# Patient Record
Sex: Female | Born: 1955 | Race: Black or African American | Hispanic: No | Marital: Married | Smoking: Current every day smoker
Health system: Southern US, Community
[De-identification: ages and names within clinical notes are randomized; demographics above are authoritative.]

## PROBLEM LIST (undated history)

## (undated) HISTORY — PX: HIP SURGERY: SHX245

---

## 2001-02-02 ENCOUNTER — Inpatient Hospital Stay (HOSPITAL_COMMUNITY): Admission: EM | Admit: 2001-02-02 | Discharge: 2001-02-03 | Payer: Self-pay | Admitting: Emergency Medicine

## 2001-02-02 ENCOUNTER — Encounter: Payer: Self-pay | Admitting: Emergency Medicine

## 2001-03-02 ENCOUNTER — Encounter: Admission: RE | Admit: 2001-03-02 | Discharge: 2001-05-31 | Payer: Self-pay | Admitting: Orthopedic Surgery

## 2001-04-15 ENCOUNTER — Emergency Department (HOSPITAL_COMMUNITY): Admission: EM | Admit: 2001-04-15 | Discharge: 2001-04-15 | Payer: Self-pay | Admitting: Emergency Medicine

## 2001-09-18 ENCOUNTER — Emergency Department (HOSPITAL_COMMUNITY): Admission: EM | Admit: 2001-09-18 | Discharge: 2001-09-19 | Payer: Self-pay | Admitting: Emergency Medicine

## 2001-09-18 ENCOUNTER — Encounter: Payer: Self-pay | Admitting: Emergency Medicine

## 2003-01-18 ENCOUNTER — Encounter: Payer: Self-pay | Admitting: Emergency Medicine

## 2003-01-19 ENCOUNTER — Encounter: Payer: Self-pay | Admitting: Emergency Medicine

## 2003-01-19 ENCOUNTER — Encounter: Payer: Self-pay | Admitting: Surgery

## 2003-01-19 ENCOUNTER — Inpatient Hospital Stay (HOSPITAL_COMMUNITY): Admission: EM | Admit: 2003-01-19 | Discharge: 2003-01-19 | Payer: Self-pay | Admitting: Emergency Medicine

## 2005-05-12 ENCOUNTER — Inpatient Hospital Stay (HOSPITAL_COMMUNITY): Admission: EM | Admit: 2005-05-12 | Discharge: 2005-05-16 | Payer: Self-pay | Admitting: Emergency Medicine

## 2005-05-17 ENCOUNTER — Ambulatory Visit: Payer: Self-pay | Admitting: Internal Medicine

## 2009-01-26 ENCOUNTER — Emergency Department (HOSPITAL_COMMUNITY): Admission: EM | Admit: 2009-01-26 | Discharge: 2009-01-27 | Payer: Self-pay | Admitting: Emergency Medicine
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
## 2018-02-18 ENCOUNTER — Encounter (HOSPITAL_COMMUNITY): Payer: Self-pay | Admitting: Emergency Medicine

## 2018-02-18 ENCOUNTER — Ambulatory Visit (HOSPITAL_COMMUNITY)
Admission: EM | Admit: 2018-02-18 | Discharge: 2018-02-18 | Disposition: A | Discharge status: Home / Self Care | Payer: Medicaid Other | Attending: Family Medicine | Admitting: Family Medicine

## 2018-02-18 ENCOUNTER — Ambulatory Visit (INDEPENDENT_AMBULATORY_CARE_PROVIDER_SITE_OTHER): Payer: Medicaid Other

## 2018-02-18 DIAGNOSIS — S52692A Other fracture of lower end of left ulna, initial encounter for closed fracture: Secondary | ICD-10-CM | POA: Diagnosis not present

## 2018-02-18 MED ORDER — HYDROCODONE-ACETAMINOPHEN 5-325 MG PO TABS: 1.0000 | ORAL_TABLET | Freq: Four times a day (QID) | ORAL | 0 refills | Status: AC | PRN

## 2018-02-18 NOTE — Progress Notes (Signed)
Orthopedic Tech Progress Note Patient Details:  Jacqueline Ballard 03-Aug-1955 [id number]  Ortho Devices Type of Ortho Device: Ace wrap, Ulna gutter splint, Arm sling Ortho Device/Splint Location: lue Ortho Device/Splint Interventions: Application   Post Interventions Patient Tolerated: Well Instructions Provided: Care of device   Nikki Dom 02/18/2018, 7:28 PM

## 2018-02-18 NOTE — Discharge Instructions (Addendum)
Be aware, pain medications may cause drowsiness. Please do not drive, operate heavy machinery or make important decisions while on this medication, it can cloud your judgement. ° °Please rest, ice and elevate the affected extremity. If not allergic, you may take Motrin 600mg every 8 hours or Tylenol 1000mg every 6 hours or as needed for discomfort. Follow up with orthopaedic surgery within one week for further evaluation. Please call for any appointment. Do not remove your splint. You may use a garbage bag while showering to keep your splint dry. Please return here if you are experiencing increased pain, tingling/numbness, swelling, redness, or fever. ° °
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
## 2018-02-21 ENCOUNTER — Ambulatory Visit (HOSPITAL_COMMUNITY)
Admission: EM | Admit: 2018-02-21 | Discharge: 2018-02-21 | Disposition: A | Discharge status: Home / Self Care | Payer: Medicaid Other | Attending: Family Medicine | Admitting: Family Medicine

## 2018-02-21 ENCOUNTER — Emergency Department (HOSPITAL_COMMUNITY)
Admission: EM | Admit: 2018-02-21 | Discharge: 2018-02-21 | Disposition: A | Discharge status: Home / Self Care | Payer: Medicaid Other | Attending: Emergency Medicine | Admitting: Emergency Medicine

## 2018-02-21 ENCOUNTER — Other Ambulatory Visit: Payer: Self-pay

## 2018-02-21 DIAGNOSIS — Y939 Activity, unspecified: Secondary | ICD-10-CM | POA: Diagnosis not present

## 2018-02-21 DIAGNOSIS — Y929 Unspecified place or not applicable: Secondary | ICD-10-CM | POA: Diagnosis not present

## 2018-02-21 DIAGNOSIS — Y999 Unspecified external cause status: Secondary | ICD-10-CM | POA: Diagnosis not present

## 2018-02-21 DIAGNOSIS — Z4789 Encounter for other orthopedic aftercare: Secondary | ICD-10-CM | POA: Insufficient documentation

## 2018-02-21 DIAGNOSIS — Z7982 Long term (current) use of aspirin: Secondary | ICD-10-CM | POA: Diagnosis not present

## 2018-02-21 DIAGNOSIS — S52602A Unspecified fracture of lower end of left ulna, initial encounter for closed fracture: Secondary | ICD-10-CM

## 2018-02-21 DIAGNOSIS — X58XXXD Exposure to other specified factors, subsequent encounter: Secondary | ICD-10-CM | POA: Insufficient documentation

## 2018-02-21 DIAGNOSIS — S52602D Unspecified fracture of lower end of left ulna, subsequent encounter for closed fracture with routine healing: Secondary | ICD-10-CM | POA: Diagnosis not present

## 2018-02-21 DIAGNOSIS — F17200 Nicotine dependence, unspecified, uncomplicated: Secondary | ICD-10-CM | POA: Insufficient documentation

## 2018-02-21 NOTE — ED Provider Notes (Signed)
[facility] EMERGENCY DEPARTMENT Provider Note  CSN: [id number] Arrival date & time: 02/21/18  0940    History   Chief Complaint Chief Complaint  Patient presents with  . Arm Injury   HPI Jacqueline Ballard is a 62 y.o. female with no significant medical history who presented to the ED for arm fracture follow-up. Patient was seen in the ED on 02/18/18 following an injury which resulted in a fractured ulna. She was instructed to follow-up with ortho, but decided instead to follow-up in the ED to get a cast. Denies any change in pain, color or temperature changes in the fingers or arm or paresthesias.  No past medical history on file.  There are no active problems to display for this patient.   Past Surgical History:  Procedure Laterality Date  . HIP SURGERY       OB History   None      Home Medications    Prior to Admission medications   Medication Sig Start Date End Date Taking? Authorizing Provider  ASPIRIN 81 PO Take by mouth.    [provider]  HYDROcodone-acetaminophen (NORCO/VICODIN) 5-325 MG tablet Take 1 tablet by mouth every 6 (six) hours as needed for moderate pain or severe pain. 02/18/18   Mardella Layman, MD    Family History Family History  Problem Relation Age of Onset  . Healthy Other     Social History Social History   Tobacco Use  . Smoking status: Current Every Day Smoker  Substance Use Topics  . Alcohol use: Yes  . Drug use: Not on file     Allergies   Patient has no known allergies.   Review of Systems Review of Systems  Constitutional: Negative.   Musculoskeletal:       Left ulna fracture  Skin: Negative for color change and pallor.  Neurological: Negative for weakness and numbness.  Hematological: Does not bruise/bleed easily.   Physical Exam Updated Vital Signs BP 119/87 (BP Location: Right Arm)   Pulse 77   Temp 98 F (36.7 C) (Oral)   Resp 16   Ht 5\' 6"  (1.676 m)   Wt 64.4 kg   SpO2 100%    BMI 22.92 kg/m   Physical Exam  Constitutional: She appears well-developed and well-nourished.  Musculoskeletal:  Ulnar gutter splint applied to the left arm. Patient able to move available fingers freely and without issue. Soft forearm compartments.  Neurological: She displays no atrophy. No sensory deficit. She exhibits normal muscle tone.  Reflex Scores:      Brachioradialis reflexes are 2+ on the right side and 2+ on the left side. Skin: Skin is warm and intact. Capillary refill takes less than 2 seconds. No erythema. No pallor.  Psychiatric: She has a normal mood and affect. Her speech is normal and behavior is normal.  Nursing note and vitals reviewed.  ED Treatments / Results  Labs (all labs ordered are listed, but only abnormal results are displayed) Labs Reviewed - No data to display  EKG None  Radiology No results found.  Procedures Procedures (including critical care time)  Medications Ordered in ED Medications - No data to display   Initial Impression / Assessment and Plan / ED Course  Triage vital signs and the nursing notes have been reviewed.  Pertinent labs & imaging results that were available during care of the patient were reviewed and considered in medical decision making (see chart for details).   Patient presents to the ED to  get a cast applied to her fractured arm. Initial splint was applied on 02/18/18 and patient received instruction to follow-up with the ortho provider, Dr. Susa Simmonds. However, patient mistakenly came to the ED because she believed that she could receive a hard cast here. On exam, patient has good capillary refill, soft forearm compartments and able to move available digits freely without issue or pain which is reassuring that patient has not developed compartment syndrome. Education provided on appropriate ortho follow-up and given contact info for Dr. Donnie Mesa office.  Final Clinical Impressions(s) / ED Diagnoses   Dispo: Home. After  thorough clinical evaluation, this patient is determined to be medically stable and can be safely discharged with the previously mentioned treatment and/or outpatient follow-up/referral(s). At this time, there are no other apparent medical conditions that require further screening, evaluation or treatment.  Final diagnoses:  Closed fracture of distal end of left ulna, unspecified fracture morphology, initial encounter    ED Discharge Orders    None        Reva Bores 02/21/18 1125    Cathren Laine, MD 02/21/18 1314

## 2018-02-21 NOTE — Discharge Instructions (Signed)
Please call Dr. Donnie Mesa office to schedule a follow-up. We do not apply the hard cast in the emergency room. You need to follow-up with the orthopedic doctor, Dr. Susa Simmonds.  I wish you a speedy recovery and healing.

## 2018-02-21 NOTE — ED Notes (Signed)
Pt here for refill of her pain medication that was given to her on 02/18/18.  Pt should still have 5 pills left and she ran out yesterday.  She was seen in ED for same thing and they were told they could not get a refill.  Dr. Delton See spoke with the provider in the ED and she has stated that we cannot refill her pain medication here as well.  Pt was in NAD.  They stated that they are waiting on the Orthopedist to return their call today to make an appointment with them to be seen.  I informed pt that he should call them back and let them know that she is in pain and needs to be seen sooner than later.

## 2018-02-21 NOTE — ED Notes (Signed)
ED Provider at bedside. 
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
## 2019-06-01 IMAGING — DX DG FOREARM 2V*L*
2 series · 2 of 2 positions shown · non-contrast
Comparison: None.

CLINICAL DATA: Injury to the left forearm

EXAM:
LEFT FOREARM - 2 VIEW

[forearm ap]
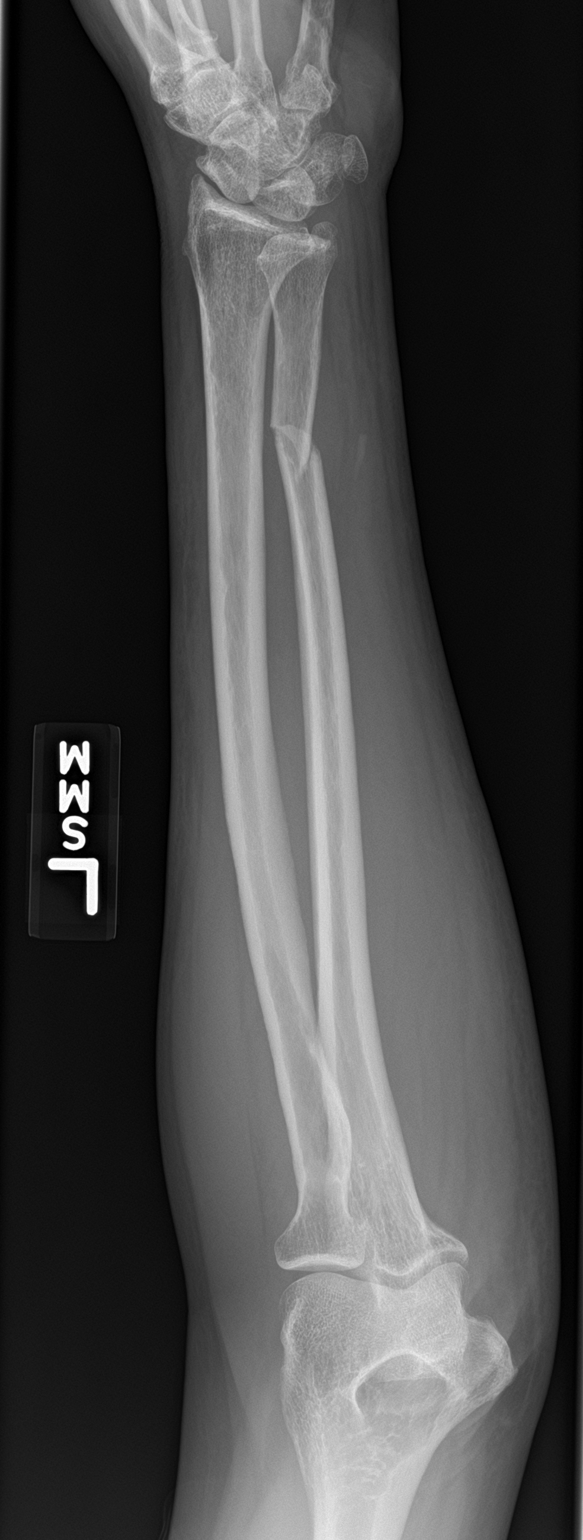

[forearm lat]
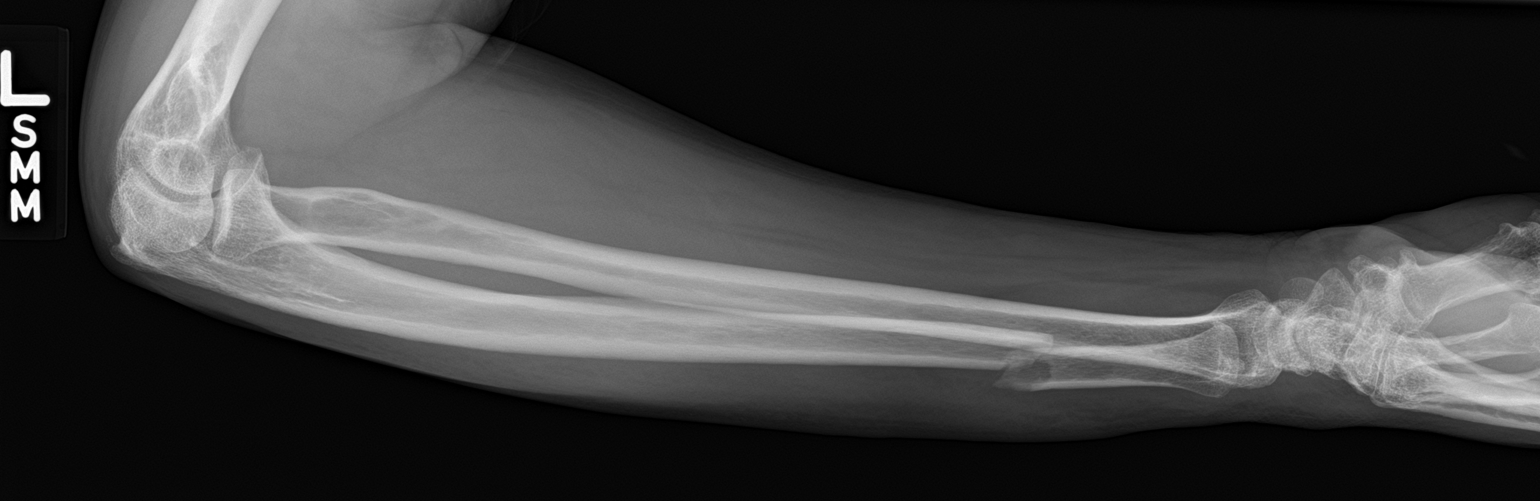

[2 of 2 positions shown; findings below may reference images not displayed]

FINDINGS: Oblique non articular distal shaft left ulna fracture with 5 mm
dorsal displacement of the distal fracture fragment, with
surrounding soft tissue swelling. No additional fracture. No
suspicious focal osseous lesion. No radiopaque foreign body.
IMPRESSION: Distal shaft left ulna fracture as detailed.

## 2019-08-25 ENCOUNTER — Ambulatory Visit: Payer: Medicaid Other | Attending: Internal Medicine

## 2019-08-25 DIAGNOSIS — Z23 Encounter for immunization: Secondary | ICD-10-CM

## 2019-08-25 NOTE — Progress Notes (Signed)
   Covid-19 Vaccination Clinic  Name:  Jacqueline Ballard    MRN: [id number] DOB: 1956/03/26  08/25/2019  Ms. Jacqueline Ballard was observed post Covid-19 immunization for 15 minutes without incident. She was provided with Vaccine Information Sheet and instruction to access the V-Safe system.   Ms. Jacqueline Ballard was instructed to call 911 with any severe reactions post vaccine: [name] Kitchen Difficulty breathing  . Swelling of face and throat  . A fast heartbeat  . A bad rash all over body  . Dizziness and weakness   Immunizations Administered    Name Date Dose VIS Date Route   Pfizer COVID-19 Vaccine 08/25/2019 10:48 AM 0.3 mL 04/20/2019 Intramuscular   Manufacturer: [employer], [employer]   Lot: [id number]   NDC: [address]-0

## 2019-09-18 ENCOUNTER — Ambulatory Visit: Payer: Medicaid Other | Attending: Internal Medicine

## 2019-09-18 DIAGNOSIS — Z23 Encounter for immunization: Secondary | ICD-10-CM

## 2019-09-18 NOTE — Progress Notes (Signed)
   Covid-19 Vaccination Clinic  Name:  Jacqueline Ballard    MRN: [id number] DOB: Dec 30, 1955  09/18/2019  [name] was observed post Covid-19 immunization for 15 minutes without incident. She was provided with Vaccine Information Sheet and instruction to access the V-Safe system.   [name] was instructed to call 911 with any severe reactions post vaccine: [name] Kitchen Difficulty breathing  . Swelling of face and throat  . A fast heartbeat  . A bad rash all over body  . Dizziness and weakness   Immunizations Administered    Name Date Dose VIS Date Route   Pfizer COVID-19 Vaccine 09/18/2019 10:02 AM 0.3 mL 07/04/2018 Intramuscular   Manufacturer: [employer], [employer]   Lot: [id number]   NDC: [address]-0

## 2020-08-28 DIAGNOSIS — Z720 Tobacco use: Secondary | ICD-10-CM | POA: Insufficient documentation

## 2020-09-01 ENCOUNTER — Other Ambulatory Visit: Payer: Self-pay | Admitting: Family Medicine

## 2020-09-01 DIAGNOSIS — Z1231 Encounter for screening mammogram for malignant neoplasm of breast: Secondary | ICD-10-CM

## 2020-09-15 DIAGNOSIS — E785 Hyperlipidemia, unspecified: Secondary | ICD-10-CM | POA: Insufficient documentation

## 2020-10-28 ENCOUNTER — Ambulatory Visit: Payer: Medicaid Other

## 2021-06-01 DIAGNOSIS — I10 Essential (primary) hypertension: Secondary | ICD-10-CM | POA: Insufficient documentation

## 2021-08-26 ENCOUNTER — Other Ambulatory Visit: Payer: Self-pay | Admitting: Nurse Practitioner

## 2021-08-26 DIAGNOSIS — Z720 Tobacco use: Secondary | ICD-10-CM

## 2021-08-26 DIAGNOSIS — Z122 Encounter for screening for malignant neoplasm of respiratory organs: Secondary | ICD-10-CM

## 2021-08-27 ENCOUNTER — Other Ambulatory Visit: Payer: Self-pay | Admitting: Nurse Practitioner

## 2021-08-27 DIAGNOSIS — Z1231 Encounter for screening mammogram for malignant neoplasm of breast: Secondary | ICD-10-CM

## 2021-09-22 ENCOUNTER — Other Ambulatory Visit: Payer: Medicaid Other

## 2021-09-22 ENCOUNTER — Ambulatory Visit: Payer: Medicaid Other

## 2021-10-15 ENCOUNTER — Ambulatory Visit: Payer: Medicaid Other

## 2024-06-13 ENCOUNTER — Inpatient Hospital Stay (HOSPITAL_COMMUNITY)
Admission: EM | Admit: 2024-06-13 | Source: Home / Self Care | Attending: Internal Medicine | Admitting: Internal Medicine

## 2024-06-13 ENCOUNTER — Emergency Department (HOSPITAL_COMMUNITY)

## 2024-06-13 DIAGNOSIS — D489 Neoplasm of uncertain behavior, unspecified: Secondary | ICD-10-CM

## 2024-06-13 DIAGNOSIS — E871 Hypo-osmolality and hyponatremia: Secondary | ICD-10-CM | POA: Diagnosis not present

## 2024-06-13 DIAGNOSIS — J09X1 Influenza due to identified novel influenza A virus with pneumonia: Secondary | ICD-10-CM | POA: Diagnosis present

## 2024-06-13 DIAGNOSIS — R112 Nausea with vomiting, unspecified: Secondary | ICD-10-CM

## 2024-06-13 DIAGNOSIS — J189 Pneumonia, unspecified organism: Secondary | ICD-10-CM

## 2024-06-13 DIAGNOSIS — E872 Acidosis, unspecified: Secondary | ICD-10-CM

## 2024-06-13 DIAGNOSIS — K567 Ileus, unspecified: Secondary | ICD-10-CM | POA: Diagnosis not present

## 2024-06-13 DIAGNOSIS — N179 Acute kidney failure, unspecified: Principal | ICD-10-CM | POA: Diagnosis present

## 2024-06-13 DIAGNOSIS — J101 Influenza due to other identified influenza virus with other respiratory manifestations: Secondary | ICD-10-CM | POA: Diagnosis present

## 2024-06-13 LAB — RESP PANEL BY RT-PCR (RSV, FLU A&B, COVID)  RVPGX2
Influenza A by PCR: POSITIVE — AB
Influenza B by PCR: NEGATIVE
Resp Syncytial Virus by PCR: NEGATIVE
SARS Coronavirus 2 by RT PCR: NEGATIVE

## 2024-06-13 LAB — CBC WITH DIFFERENTIAL/PLATELET
Abs Immature Granulocytes: 0.04 10*3/uL (ref 0.00–0.07)
Basophils Absolute: 0 10*3/uL (ref 0.0–0.1)
Basophils Relative: 0 %
Eosinophils Absolute: 0 10*3/uL (ref 0.0–0.5)
Eosinophils Relative: 0 %
HCT: 45.8 % (ref 36.0–46.0)
Hemoglobin: 14.7 g/dL (ref 12.0–15.0)
Immature Granulocytes: 1 %
Lymphocytes Relative: 16 %
Lymphs Abs: 1.4 10*3/uL (ref 0.7–4.0)
MCH: 23.3 pg — ABNORMAL LOW (ref 26.0–34.0)
MCHC: 32.1 g/dL (ref 30.0–36.0)
MCV: 72.6 fL — ABNORMAL LOW (ref 80.0–100.0)
Monocytes Absolute: 1 10*3/uL (ref 0.1–1.0)
Monocytes Relative: 12 %
Neutro Abs: 6.1 10*3/uL (ref 1.7–7.7)
Neutrophils Relative %: 71 %
Platelets: 157 10*3/uL (ref 150–400)
RBC: 6.31 MIL/uL — ABNORMAL HIGH (ref 3.87–5.11)
RDW: 13.1 % (ref 11.5–15.5)
WBC: 8.5 10*3/uL (ref 4.0–10.5)
nRBC: 0 % (ref 0.0–0.2)

## 2024-06-13 LAB — URINALYSIS, ROUTINE W REFLEX MICROSCOPIC
Bilirubin Urine: NEGATIVE
Glucose, UA: NEGATIVE mg/dL
Ketones, ur: NEGATIVE mg/dL
Nitrite: NEGATIVE
Protein, ur: 30 mg/dL — AB
Specific Gravity, Urine: 1.02 (ref 1.005–1.030)
pH: 6 (ref 5.0–8.0)

## 2024-06-13 LAB — COMPREHENSIVE METABOLIC PANEL WITH GFR
ALT: 52 U/L — ABNORMAL HIGH (ref 0–44)
AST: 64 U/L — ABNORMAL HIGH (ref 15–41)
Albumin: 4.2 g/dL (ref 3.5–5.0)
Alkaline Phosphatase: 63 U/L (ref 38–126)
Anion gap: 16 — ABNORMAL HIGH (ref 5–15)
BUN: 86 mg/dL — ABNORMAL HIGH (ref 8–23)
CO2: 24 mmol/L (ref 22–32)
Calcium: 9.4 mg/dL (ref 8.9–10.3)
Chloride: 88 mmol/L — ABNORMAL LOW (ref 98–111)
Creatinine, Ser: 1.99 mg/dL — ABNORMAL HIGH (ref 0.44–1.00)
GFR, Estimated: 27 mL/min — ABNORMAL LOW
Glucose, Bld: 147 mg/dL — ABNORMAL HIGH (ref 70–99)
Potassium: 3.7 mmol/L (ref 3.5–5.1)
Sodium: 128 mmol/L — ABNORMAL LOW (ref 135–145)
Total Bilirubin: 1 mg/dL (ref 0.0–1.2)
Total Protein: 8.5 g/dL — ABNORMAL HIGH (ref 6.5–8.1)

## 2024-06-13 LAB — BASIC METABOLIC PANEL WITH GFR
Anion gap: 13 (ref 5–15)
BUN: 71 mg/dL — ABNORMAL HIGH (ref 8–23)
CO2: 23 mmol/L (ref 22–32)
Calcium: 8.7 mg/dL — ABNORMAL LOW (ref 8.9–10.3)
Chloride: 93 mmol/L — ABNORMAL LOW (ref 98–111)
Creatinine, Ser: 1.51 mg/dL — ABNORMAL HIGH (ref 0.44–1.00)
GFR, Estimated: 38 mL/min — ABNORMAL LOW
Glucose, Bld: 109 mg/dL — ABNORMAL HIGH (ref 70–99)
Potassium: 3.3 mmol/L — ABNORMAL LOW (ref 3.5–5.1)
Sodium: 129 mmol/L — ABNORMAL LOW (ref 135–145)

## 2024-06-13 MED ORDER — ACETAMINOPHEN 325 MG PO TABS: 650.0000 mg | ORAL_TABLET | Freq: Four times a day (QID) | ORAL | Status: AC | PRN

## 2024-06-13 MED ORDER — LACTATED RINGERS IV BOLUS
500.0000 mL | Freq: Once | INTRAVENOUS | Status: AC
  Administered 2024-06-13: 500 mL via INTRAVENOUS

## 2024-06-13 MED ORDER — OSELTAMIVIR PHOSPHATE 30 MG PO CAPS
30.0000 mg | ORAL_CAPSULE | Freq: Every day | ORAL | Status: AC
  Administered 2024-06-14 – 2024-06-15 (×2): 30 mg via ORAL
  Filled 2024-06-13 (×2): qty 1

## 2024-06-13 MED ORDER — OSELTAMIVIR PHOSPHATE 30 MG PO CAPS
30.0000 mg | ORAL_CAPSULE | Freq: Once | ORAL | Status: AC
  Administered 2024-06-13: 30 mg via ORAL
  Filled 2024-06-13: qty 1

## 2024-06-13 MED ORDER — ONDANSETRON HCL 4 MG/2ML IJ SOLN
4.0000 mg | Freq: Once | INTRAMUSCULAR | Status: AC
  Administered 2024-06-13: 4 mg via INTRAVENOUS
  Filled 2024-06-13: qty 2

## 2024-06-13 MED ORDER — SODIUM CHLORIDE 0.9% FLUSH
3.0000 mL | Freq: Two times a day (BID) | INTRAVENOUS | Status: AC
  Administered 2024-06-13 – 2024-06-15 (×5): 3 mL via INTRAVENOUS

## 2024-06-13 MED ORDER — SODIUM CHLORIDE 0.9 % IV BOLUS (SEPSIS)
500.0000 mL | Freq: Once | INTRAVENOUS | Status: AC
  Administered 2024-06-13: 500 mL via INTRAVENOUS

## 2024-06-13 MED ORDER — ONDANSETRON HCL 4 MG PO TABS: 4.0000 mg | ORAL_TABLET | Freq: Four times a day (QID) | ORAL | Status: AC | PRN

## 2024-06-13 MED ORDER — BISACODYL 5 MG PO TBEC: 5.0000 mg | DELAYED_RELEASE_TABLET | Freq: Every day | ORAL | Status: AC | PRN

## 2024-06-13 MED ORDER — ONDANSETRON HCL 4 MG/2ML IJ SOLN: 4.0000 mg | Freq: Four times a day (QID) | INTRAMUSCULAR | Status: AC | PRN

## 2024-06-13 MED ORDER — LACTATED RINGERS IV SOLN: INTRAVENOUS | Status: AC

## 2024-06-13 MED ORDER — ACETAMINOPHEN 650 MG RE SUPP: 650.0000 mg | Freq: Four times a day (QID) | RECTAL | Status: AC | PRN

## 2024-06-13 MED ORDER — ASPIRIN 81 MG PO TBEC
81.0000 mg | DELAYED_RELEASE_TABLET | Freq: Every day | ORAL | Status: AC
  Administered 2024-06-14 – 2024-06-15 (×2): 81 mg via ORAL
  Filled 2024-06-13 (×2): qty 1

## 2024-06-13 MED ORDER — AZITHROMYCIN 250 MG PO TABS
500.0000 mg | ORAL_TABLET | Freq: Once | ORAL | Status: AC
  Administered 2024-06-13: 500 mg via ORAL
  Filled 2024-06-13: qty 2

## 2024-06-13 MED ORDER — ENOXAPARIN SODIUM 30 MG/0.3ML IJ SOSY
30.0000 mg | PREFILLED_SYRINGE | INTRAMUSCULAR | Status: AC
  Administered 2024-06-13 – 2024-06-15 (×3): 30 mg via SUBCUTANEOUS
  Filled 2024-06-13 (×3): qty 0.3

## 2024-06-13 MED ORDER — BISMUTH SUBSALICYLATE 262 MG/15ML PO SUSP: 30.0000 mL | ORAL | Status: AC | PRN

## 2024-06-13 MED ORDER — SODIUM CHLORIDE 0.9 % IV SOLN
1.0000 g | Freq: Once | INTRAVENOUS | Status: AC
  Administered 2024-06-13: 1 g via INTRAVENOUS
  Filled 2024-06-13: qty 10

## 2024-06-13 NOTE — ED Notes (Signed)
 Pt to CT

## 2024-06-13 NOTE — Progress Notes (Signed)
 PHARMACY NOTE:  ANTIMICROBIAL RENAL DOSAGE ADJUSTMENT  Current antimicrobial regimen includes a mismatch between antimicrobial dosage and estimated renal function.  As per policy approved by the Pharmacy & Therapeutics and Medical Executive Committees, the antimicrobial dosage will be adjusted accordingly.  Current antimicrobial dosage:  oseltamivir  75 mg BID  Indication: influenza treatment  Renal Function:  CrCl cannot be calculated (Unknown ideal weight.). Last weight ~ 52 kg CrCl < 30 ml/min    Antimicrobial dosage has been changed to:  oseltamivir  30 mg once daily   Thank you for allowing pharmacy to be a part of this patient's care.  Stefano MARLA Bologna, PharmD, BCPS Clinical Pharmacist 06/13/2024 5:22 PM

## 2024-06-13 NOTE — ED Provider Notes (Signed)
 " [place] EMERGENCY DEPARTMENT AT [facility] Provider Note   CSN: [id number] Arrival date & time: 06/13/24  9046     Patient presents with: Cough, Nausea, and Emesis   Jacqueline Ballard is a 69 y.o. female.  No past medical history on file.   Cough Emesis Associated symptoms: cough   69 year old female presents to the ED for cough nausea vomiting, vaginal bleeding.  Patient is a poor historian.  Patient endorses a productive cough for the last 7 days with some associated vomiting though she cannot tell she has been vomiting.  States she has also had some recent diarrhea.  Denies any acute abdominal pain at this time.  No shortness of breath or subjective fevers.  Patient also endorsing cramping and vaginal bleeding which she states is because of her period.  She states she gets her period on the first of every month.  Does not appear to have had any primary care or OB/GYN care recently.  Denies hematemesis, any active medical problems, chest pain, shortness of breath.    Prior to Admission medications  Medication Sig Start Date End Date Taking? Authorizing Provider  aspirin  EC 81 MG tablet Take 81 mg by mouth daily. Swallow whole.   Yes [provider]  UNKNOWN TO PATIENT Place 1 drop into both eyes See admin instructions. Unnamed OTC eye drops - Instill 1 drop into both eyes 3 times a day as needed for itching or irritation   Yes [provider]  HYDROcodone -acetaminophen  (NORCO/VICODIN) 5-325 MG tablet Take 1 tablet by mouth every 6 (six) hours as needed for moderate pain or severe pain. Patient not taking: Reported on 06/13/2024 02/18/18   Rolinda Rogue, MD    Allergies: Patient has no known allergies.    Review of Systems  Respiratory:  Positive for cough.   Gastrointestinal:  Positive for vomiting.    Updated Vital Signs BP (!) 100/52 (BP Location: Right Arm)   Pulse 85   Temp 97.6 F (36.4 C)   Resp 16   SpO2 96%   Physical  Exam Vitals and nursing note reviewed.  Constitutional:      General: She is not in acute distress.    Appearance: She is well-developed.  HENT:     Head: Normocephalic and atraumatic.  Eyes:     Conjunctiva/sclera: Conjunctivae normal.  Cardiovascular:     Rate and Rhythm: Normal rate and regular rhythm.     Heart sounds: No murmur heard. Pulmonary:     Effort: Pulmonary effort is normal. No respiratory distress.     Breath sounds: Normal breath sounds.  Abdominal:     Palpations: Abdomen is soft.     Tenderness: There is no abdominal tenderness.  Musculoskeletal:        General: No swelling.     Cervical back: Neck supple.  Skin:    General: Skin is warm and dry.     Capillary Refill: Capillary refill takes less than 2 seconds.  Neurological:     Mental Status: She is alert.  Psychiatric:        Mood and Affect: Mood normal.     (all labs ordered are listed, but only abnormal results are displayed) Labs Reviewed  RESP PANEL BY RT-PCR (RSV, FLU A&B, COVID)  RVPGX2 - Abnormal; Notable for the following components:      Result Value   Influenza A by PCR POSITIVE (*)    All other components within normal limits  CBC WITH DIFFERENTIAL/PLATELET -  Abnormal; Notable for the following components:   RBC 6.31 (*)    MCV 72.6 (*)    MCH 23.3 (*)    All other components within normal limits  COMPREHENSIVE METABOLIC PANEL WITH GFR - Abnormal; Notable for the following components:   Sodium 128 (*)    Chloride 88 (*)    Glucose, Bld 147 (*)    BUN 86 (*)    Creatinine, Ser 1.99 (*)    Total Protein 8.5 (*)    AST 64 (*)    ALT 52 (*)    GFR, Estimated 27 (*)    Anion gap 16 (*)    All other components within normal limits  URINALYSIS, ROUTINE W REFLEX MICROSCOPIC - Abnormal; Notable for the following components:   Color, Urine AMBER (*)    APPearance HAZY (*)    Hgb urine dipstick SMALL (*)    Protein, ur 30 (*)    Leukocytes,Ua SMALL (*)    Bacteria, UA MANY (*)    All  other components within normal limits  BASIC METABOLIC PANEL WITH GFR - Abnormal; Notable for the following components:   Sodium 129 (*)    Potassium 3.3 (*)    Chloride 93 (*)    Glucose, Bld 109 (*)    BUN 71 (*)    Creatinine, Ser 1.51 (*)    Calcium 8.7 (*)    GFR, Estimated 38 (*)    All other components within normal limits  CULTURE, BLOOD (ROUTINE X 2)  CULTURE, BLOOD (ROUTINE X 2)  URINE CULTURE  HIV ANTIBODY (ROUTINE TESTING W REFLEX)  MAGNESIUM  BASIC METABOLIC PANEL WITH GFR  CBC  TSH    EKG: None  Radiology: CT ABDOMEN PELVIS WO CONTRAST Result Date: 06/13/2024 CLINICAL DATA:  Abd pain/vaginal bleeding. EXAM: CT ABDOMEN AND PELVIS WITHOUT CONTRAST TECHNIQUE: Multidetector CT imaging of the abdomen and pelvis was performed following the standard protocol without IV contrast. RADIATION DOSE REDUCTION: This exam was performed according to the departmental dose-optimization program which includes automated exposure control, adjustment of the mA and/or kV according to patient size and/or use of iterative reconstruction technique. COMPARISON:  None Available. FINDINGS: Lower chest: There are heterogeneous airspace opacities in the left lung lower lobe with associated occlusive and nonocclusive filling defects in the left lung lower lobe distal segmental and subsegmental bronchial tree. Findings may be due to aspiration pneumonia or infective pneumonia. Correlate clinically. Bilateral lungs are otherwise clear. No pleural effusion. Normal heart size. No pericardial effusion. Hepatobiliary: The liver is normal in size. Non-cirrhotic configuration. No suspicious mass. No intrahepatic or extrahepatic bile duct dilation. The gallbladder is partially contracted and exhibit multiple dependent gallstones without imaging signs of acute cholecystitis. Pancreas: Unremarkable. No pancreatic ductal dilatation or surrounding inflammatory changes. Spleen: Within normal limits. No focal lesion.  Adrenals/Urinary Tract: Adrenal glands are unremarkable. No suspicious renal mass within the limitations of this unenhanced exam. Urinary bladder is under distended, precluding optimal assessment. However, no large mass or stones identified. No perivesical fat stranding. Stomach/Bowel: There is disproportionate dilation of multiple small bowel loops with diameter measuring up to 4 cm. There is also fecalization of multiple small bowel loops. However, no discrete transition point seen. The colon is nondistended. Stomach and duodenum are also not distended. Vascular/Lymphatic: No ascites or pneumoperitoneum. No abdominal or pelvic lymphadenopathy, by size criteria. No aneurysmal dilation of the major abdominal arteries. There are mild peripheral atherosclerotic vascular calcifications of the aorta and its major branches. Reproductive: Not well  evaluated on the CT scan exam however, note is made of lobulated uterus containing several calcified and noncalcified leiomyomas. There is a macrolobulated predominantly macroscopic fat attenuation 7.1 x 9.9 cm lesion in the right lower abdomen which contains few soft tissue attenuation areas along the inferior aspect and few dystrophic calcifications. 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 Left ovary is also not distinctly seen. Other: The visualized soft tissues and abdominal wall are unremarkable. Musculoskeletal: No suspicious osseous lesions. There are mild multilevel degenerative changes in the visualized spine. IMPRESSION: 1. There is disproportionate dilation of multiple small bowel loops with diameter measuring up to 4 cm. There is also fecalization of multiple small bowel loops. However, no discrete transition point seen. The colon is nondistended. Findings favor small bowel ileus. However, early small bowel obstruction cannot be excluded. Correlate clinically.  2. There is a macrolobulated predominantly macroscopic fat attenuation 7.1 x 9.9 cm lesion in the right lower abdomen which contains few soft tissue attenuation areas along the inferior aspect and few dystrophic calcifications. 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 Left ovary is also not distinctly seen. 3. There are heterogeneous airspace opacities in the left lung lower lobe with associated occlusive and nonocclusive filling defects in the left lung lower lobe distal segmental and subsegmental bronchial tree. Findings may be due to aspiration pneumonia or infective pneumonia. 4. Cholelithiasis without acute cholecystitis. Aortic Atherosclerosis (ICD10-I70.0). Electronically Signed   By: Ree Molt M.D.   On: 06/13/2024 15:05   DG Chest 2 View Result Date: 06/13/2024 CLINICAL DATA:  Flu like symptoms x1 week. EXAM: CHEST - 2 VIEW COMPARISON:  None Available. FINDINGS: The heart size and mediastinal contours are within normal limits. Low lung volumes are noted with mild elevation of the left hemidiaphragm. Mild left basilar infiltrate is seen. No pleural effusion or pneumothorax is identified. The visualized skeletal structures are unremarkable. IMPRESSION: Mild left basilar infiltrate. Electronically Signed   By: Suzen Dials M.D.   On: 06/13/2024 13:32     .Critical Care  Performed by: Harold Tillman DASEN, PA-C Authorized by: Harold Tillman DASEN, PA-C   Critical care provider statement:    Critical care time (minutes):  30   Critical care was time spent personally by me on the following activities:  Development of treatment plan with patient or surrogate, discussions with consultants, evaluation of patient's response to treatment, examination of patient, ordering and review of laboratory studies, ordering and review of radiographic studies, ordering and performing treatments and  interventions, pulse oximetry, re-evaluation of patient's condition and review of old charts    Medications Ordered in the ED  enoxaparin  (LOVENOX ) injection 30 mg (has no administration in time range)  sodium chloride  flush (NS) 0.9 % injection 3 mL (has no administration in time range)  acetaminophen  (TYLENOL ) tablet 650 mg (has no administration in time range)    Or  acetaminophen  (TYLENOL ) suppository 650 mg (has no administration in time range)  ondansetron  (ZOFRAN ) tablet 4 mg (has no administration in time range)    Or  ondansetron  (ZOFRAN ) injection 4 mg (has no administration in time range)  bisacodyl  (DULCOLAX) EC tablet 5 mg (has no administration in time range)  bismuth  subsalicylate (PEPTO BISMOL) 262 MG/15ML suspension 30 mL (has no administration in time range)  lactated ringers  infusion (has no administration in  time range)  oseltamivir  (TAMIFLU ) capsule 30 mg (has no administration in time range)  aspirin  EC tablet 81 mg (has no administration in time range)  lactated ringers  bolus 500 mL (0 mLs Intravenous Stopped 06/13/24 1526)  ondansetron  (ZOFRAN ) injection 4 mg (4 mg Intravenous Given 06/13/24 1334)  lactated ringers  bolus 500 mL (0 mLs Intravenous Stopped 06/13/24 1526)  sodium chloride  0.9 % bolus 500 mL (0 mLs Intravenous Stopped 06/13/24 1556)  cefTRIAXone  (ROCEPHIN ) 1 g in sodium chloride  0.9 % 100 mL IVPB (0 g Intravenous Stopped 06/13/24 1556)  azithromycin  (ZITHROMAX ) tablet 500 mg (500 mg Oral Given 06/13/24 1416)  oseltamivir  (TAMIFLU ) capsule 30 mg (30 mg Oral Given 06/13/24 1611)    Clinical Course as of 06/13/24 2104  Wed Jun 13, 2024  1403 Sodium(!): 128 hyponatremic [GC]    Clinical Course User Index [GC] Pavielle Biggar, Tillman DASEN, PA-C                                 Medical Decision Making Amount and/or Complexity of Data Reviewed Labs: ordered. Decision-making details documented in ED Course. Radiology: ordered.  Risk Prescription drug management. Decision  regarding hospitalization.   This patient presents to the ED for concern of nausea, vomiting, cough, this involves an extensive number of treatment options, and is a complaint that carries with it a high risk of complications and morbidity.  The differential diagnosis includes viral illness, pneumonia, acute abdominal pathology, UTI   Co morbidities that complicate the patient evaluation  Patient is a poor historian     Lab Tests:  I Ordered, and personally interpreted labs.  The pertinent results include: Patient is flu positive, hyponatremic to 128, BUN/creatinine of 86/1.99 (baseline unknown), slight transaminitis, no leukocytosis or anemia.  UA showing leukocytosis, bacteria.   Imaging Studies ordered:  I ordered imaging studies including chest x-ray, CT abdomen I independently visualized and interpreted imaging which showed pneumonia. CT shows concern for early small bowel obstruction versus ileus, possible teratoma of the left ovary, left lung pneumonia, cholelithiasis without acute cholecystitis. I agree with the radiologist interpretation    Medicines ordered and prescription drug management:  I ordered medication including Zofran  for nausea.  Azithromycin  and Rocephin  for pneumonia. Reevaluation of the patient after these medicines showed that the patient improved I have reviewed the patients home medicines and have made adjustments as needed   Consultations Obtained:  I requested consultation with the hospitalist,  and discussed lab and imaging findings as well as pertinent plan - they recommend: Admission for treatment for pneumonia, hyponatremia, small bowel pathology.   Problem List / ED Course:  69 year old female presented to the ED for nausea, vomiting, and cough, vaginal bleeding.  Patient is a poor historian and provided limited history.  No psychiatric history on file. Per Care Everywhere and chart review it appears patient has not received any medical care  in the last 3 years.  Patient had broad symptoms so worked up for abdominal complaint and cough.  Labs are significant for positive flu, possible AKI though baseline is unknown as well as hyponatremia and a UTI.  CT showed multiple pathologies including ileus versus early small bowel obstruction, pneumonia and a possible teratoma of the ovary.  First dose of antibiotics given in the ED for pneumonia.  Given extensive findings and altered mental status, patient to be admitted for further workup and management.   Reevaluation:  After the interventions noted above,  I reevaluated the patient and found that they have :stayed the same   Social Determinants of Health:  Patient states she lives with her son and nephew.  Was unable to provide phone number as her phone was dead.  Tried to use number in our chart though that number was not connected.   Dispostion:  After consideration of the diagnostic results and the patients response to treatment, I feel that the patent would benefit from admission for further workup and treatment.      Final diagnoses:  AKI (acute kidney injury)  Pneumonia of left lower lobe due to infectious organism  Teratoma  Nausea and vomiting, unspecified vomiting type  Influenza A    ED Discharge Orders     None          Harold Tillman DASEN, PA-C 06/13/24 2105  "

## 2024-06-13 NOTE — ED Triage Notes (Signed)
 Patient reports flu like symptoms for 1 week, cough/n/v, deneis fevers. Patient is alert and oriented x 4. Airway patent, respirations even and unlabored. Skin normal, warm and dry.

## 2024-06-13 NOTE — ED Notes (Signed)
 Pt to xray

## 2024-06-13 NOTE — ED Notes (Signed)
 Lab to add on urine culture to previously collected urine

## 2024-06-13 NOTE — H&P (Signed)
 " History and Physical    Patient: Jacqueline Ballard FMW:[id number] DOB: July 10, 1955 DOA: 06/13/2024 DOS: the patient was seen and examined on 06/13/2024 PCP: Patient, No Pcp Per  Patient coming from: Home  Chief Complaint:  Chief Complaint  Patient presents with   Cough   Nausea   Emesis   HPI: Jacqueline Ballard is a frail  69 y.o. female with no known chronic medical conditions.  She takes no medications at baseline.  Who was brought in for weakness.  The patient was out with family in the car and when they brought her home she was too weak to get out of the car so they turned the car around and brought her straight to the emergency department. She reports 4 to 5 days of nausea, vomiting, and diarrhea.  She has also been having diffuse abdominal pain.  She denies any fevers or chills or shortness of breath or chest pain. In the ER her workup included CT scan of the abdomen and pelvis which revealed possible early small bowel obstruction.  Cholelithiasis without acute cholecystitis and left lower lobe opacities with occlusive and nonocclusive filling defects.  She was also found to be influenza A positive. Her blood work revealed a sodium of 128 creatinine of 1.99.  Blood sugar 147.  Her hemoglobin is 14.7 but MCV is 72.  AST is elevated at 64.  ALT is 52 total bili is 1.0. Anion gap is 16.  In the emergency department the patient received 1.5 L of IV fluids.  She also received Rocephin  and Zithromax  and 1 dose of Tamiflu .  She will be admitted to the hospitalist service for further management and treatment. She reports that she has never had the flu before.  No one in her household is sick.   Review of Systems: As mentioned in the history of present illness. All other systems reviewed and are negative. No past medical history on file. Past Surgical History:  Procedure Laterality Date   HIP SURGERY     Social History:  reports that she has been smoking. She does not have any smokeless  tobacco history on file. She reports current alcohol use. No history on file for drug use.  Allergies[1]  Family History  Problem Relation Age of Onset   Healthy Other     Prior to Admission medications  Medication Sig Start Date End Date Taking? Authorizing Provider  ASPIRIN  81 PO Take by mouth.    [provider]  HYDROcodone -acetaminophen  (NORCO/VICODIN) 5-325 MG tablet Take 1 tablet by mouth every 6 (six) hours as needed for moderate pain or severe pain. 02/18/18   Rolinda Rogue, MD    Physical Exam: Vitals:   06/13/24 1003 06/13/24 1419  BP: 121/87 135/86  Pulse: 90 90  Resp: 16 16  Temp: 98.6 F (37 C) 97.6 F (36.4 C)  TempSrc: Oral Oral  SpO2: 100% 95%   Physical Exam:  General: She is curled up and looks like she is not feeling well, malnourished HEENT: Normocephalic, atraumatic, PERRL Cardiovascular: Normal rate and rhythm. Distal pulses intact. Pulmonary: Normal pulmonary effort, normal breath sounds Gastrointestinal: Nondistended abdomen, soft, diffusely tender, normoactive bowel sounds Musculoskeletal:Normal ROM, no lower ext edema Lymphadenopathy: No cervical LAD. Skin: Skin is warm and dry. Neuro: No focal deficits noted, AAOx3. PSYCH: Attentive and cooperative  Data Reviewed:  Results for orders placed or performed during the hospital encounter of 06/13/24 (from the past 24 hours)  Resp panel by RT-PCR (RSV, Flu A&B, Covid) Anterior  Nasal Swab     Status: Abnormal   Collection Time: 06/13/24 10:18 AM   Specimen: Anterior Nasal Swab  Result Value Ref Range   SARS Coronavirus 2 by RT PCR NEGATIVE NEGATIVE   Influenza A by PCR POSITIVE (A) NEGATIVE   Influenza B by PCR NEGATIVE NEGATIVE   Resp Syncytial Virus by PCR NEGATIVE NEGATIVE  CBC with Differential     Status: Abnormal   Collection Time: 06/13/24  1:23 PM  Result Value Ref Range   WBC 8.5 4.0 - 10.5 K/uL   RBC 6.31 (H) 3.87 - 5.11 MIL/uL   Hemoglobin 14.7 12.0 - 15.0 g/dL   HCT  54.1 63.9 - 53.9 %   MCV 72.6 (L) 80.0 - 100.0 fL   MCH 23.3 (L) 26.0 - 34.0 pg   MCHC 32.1 30.0 - 36.0 g/dL   RDW 86.8 88.4 - 84.4 %   Platelets 157 150 - 400 K/uL   nRBC 0.0 0.0 - 0.2 %   Neutrophils Relative % 71 %   Neutro Abs 6.1 1.7 - 7.7 K/uL   Lymphocytes Relative 16 %   Lymphs Abs 1.4 0.7 - 4.0 K/uL   Monocytes Relative 12 %   Monocytes Absolute 1.0 0.1 - 1.0 K/uL   Eosinophils Relative 0 %   Eosinophils Absolute 0.0 0.0 - 0.5 K/uL   Basophils Relative 0 %   Basophils Absolute 0.0 0.0 - 0.1 K/uL   WBC Morphology See Note    Immature Granulocytes 1 %   Abs Immature Granulocytes 0.04 0.00 - 0.07 K/uL  Comprehensive metabolic panel     Status: Abnormal   Collection Time: 06/13/24  1:23 PM  Result Value Ref Range   Sodium 128 (L) 135 - 145 mmol/L   Potassium 3.7 3.5 - 5.1 mmol/L   Chloride 88 (L) 98 - 111 mmol/L   CO2 24 22 - 32 mmol/L   Glucose, Bld 147 (H) 70 - 99 mg/dL   BUN 86 (H) 8 - 23 mg/dL   Creatinine, Ser 8.00 (H) 0.44 - 1.00 mg/dL   Calcium 9.4 8.9 - 89.6 mg/dL   Total Protein 8.5 (H) 6.5 - 8.1 g/dL   Albumin 4.2 3.5 - 5.0 g/dL   AST 64 (H) 15 - 41 U/L   ALT 52 (H) 0 - 44 U/L   Alkaline Phosphatase 63 38 - 126 U/L   Total Bilirubin 1.0 0.0 - 1.2 mg/dL   GFR, Estimated 27 (L) >60 mL/min   Anion gap 16 (H) 5 - 15  Urinalysis, Routine w reflex microscopic -Urine, Clean Catch     Status: Abnormal   Collection Time: 06/13/24  1:28 PM  Result Value Ref Range   Color, Urine AMBER (A) YELLOW   APPearance HAZY (A) CLEAR   Specific Gravity, Urine 1.020 1.005 - 1.030   pH 6.0 5.0 - 8.0   Glucose, UA NEGATIVE NEGATIVE mg/dL   Hgb urine dipstick SMALL (A) NEGATIVE   Bilirubin Urine NEGATIVE NEGATIVE   Ketones, ur NEGATIVE NEGATIVE mg/dL   Protein, ur 30 (A) NEGATIVE mg/dL   Nitrite NEGATIVE NEGATIVE   Leukocytes,Ua SMALL (A) NEGATIVE   RBC / HPF 0-5 0 - 5 RBC/hpf   WBC, UA 6-10 0 - 5 WBC/hpf   Bacteria, UA MANY (A) NONE SEEN   Squamous Epithelial / HPF 0-5 0  - 5 /HPF   Mucus PRESENT      CT Abdomen pelvis IMPRESSION: 1. There is disproportionate dilation of multiple small bowel loops with  diameter measuring up to 4 cm. There is also fecalization of multiple small bowel loops. However, no discrete transition point seen. The colon is nondistended. Findings favor small bowel ileus. However, early small bowel obstruction cannot be excluded. Correlate clinically. 2. There is a macrolobulated predominantly macroscopic fat attenuation 7.1 x 9.9 cm lesion in the right lower abdomen which contains few soft tissue attenuation areas along the inferior aspect and few dystrophic calcifications. 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 Left ovary is also not distinctly seen. 3. There are heterogeneous airspace opacities in the left lung lower lobe with associated occlusive and nonocclusive filling defects in the left lung lower lobe distal segmental and subsegmental bronchial tree. Findings may be due to aspiration pneumonia or infective pneumonia. 4. Cholelithiasis without acute cholecystitis.  Assessment and Plan: Infuenza A w LLL pneumonia -   - Continue Tamiflu  x 5 days With no fever, chills, respiratory symptoms, or elevated white blood cell count I do not know that she needs IV antibiotics at this time. Ileus - IV fluids. supportive care N/V/dehydrationm/AKI/hyponatremia/ Metaboloic acidosis - IV fluid hydration - symptomatic care - Monitor creatinine.  Baseline is unknown. - Monitor sodium, electrolytes  4. 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  Consults: None  Family Communication: None  Severity of Illness: The appropriate patient status for  this patient is INPATIENT. Inpatient status is judged to be reasonable and necessary in order to provide the required intensity of service to ensure the patient's safety. The patient's presenting symptoms, physical exam findings, and initial radiographic and laboratory data in the context of their chronic comorbidities is felt to place them at high risk for further clinical deterioration. Furthermore, it is not anticipated that the patient will be medically stable for discharge from the hospital within 2 midnights of admission.   * I certify that at the point of admission it is my clinical judgment that the patient will require inpatient hospital care spanning beyond 2 midnights from the point of admission due to high intensity of service, high risk for further deterioration and high frequency of surveillance required.*  Author: ARTHEA CHILD, MD 06/13/2024 3:37 PM  For on call review [email or web address].      [1] No Known Allergies  "

## 2024-06-14 LAB — BASIC METABOLIC PANEL WITH GFR
Anion gap: 12 (ref 5–15)
BUN: 49 mg/dL — ABNORMAL HIGH (ref 8–23)
CO2: 24 mmol/L (ref 22–32)
Calcium: 8.4 mg/dL — ABNORMAL LOW (ref 8.9–10.3)
Chloride: 94 mmol/L — ABNORMAL LOW (ref 98–111)
Creatinine, Ser: 1.2 mg/dL — ABNORMAL HIGH (ref 0.44–1.00)
GFR, Estimated: 50 mL/min — ABNORMAL LOW
Glucose, Bld: 93 mg/dL (ref 70–99)
Potassium: 3.1 mmol/L — ABNORMAL LOW (ref 3.5–5.1)
Sodium: 130 mmol/L — ABNORMAL LOW (ref 135–145)

## 2024-06-14 LAB — CBC
HCT: 33.7 % — ABNORMAL LOW (ref 36.0–46.0)
Hemoglobin: 11.2 g/dL — ABNORMAL LOW (ref 12.0–15.0)
MCH: 23.9 pg — ABNORMAL LOW (ref 26.0–34.0)
MCHC: 33.2 g/dL (ref 30.0–36.0)
MCV: 72 fL — ABNORMAL LOW (ref 80.0–100.0)
Platelets: 131 10*3/uL — ABNORMAL LOW (ref 150–400)
RBC: 4.68 MIL/uL (ref 3.87–5.11)
RDW: 13 % (ref 11.5–15.5)
WBC: 6.3 10*3/uL (ref 4.0–10.5)
nRBC: 0 % (ref 0.0–0.2)

## 2024-06-14 LAB — HIV ANTIBODY (ROUTINE TESTING W REFLEX): HIV Screen 4th Generation wRfx: NONREACTIVE

## 2024-06-14 LAB — MAGNESIUM: Magnesium: 2.6 mg/dL — ABNORMAL HIGH (ref 1.7–2.4)

## 2024-06-14 LAB — TSH: TSH: 0.755 u[IU]/mL (ref 0.350–4.500)

## 2024-06-14 MED ORDER — POTASSIUM CHLORIDE CRYS ER 20 MEQ PO TBCR
40.0000 meq | EXTENDED_RELEASE_TABLET | ORAL | Status: AC
  Administered 2024-06-14 (×2): 40 meq via ORAL
  Filled 2024-06-14 (×2): qty 2

## 2024-06-14 MED ORDER — SODIUM CHLORIDE 0.9 % IV SOLN
1.0000 g | Freq: Every day | INTRAVENOUS | Status: DC
  Administered 2024-06-14 – 2024-06-15 (×2): 1 g via INTRAVENOUS
  Filled 2024-06-14 (×2): qty 10

## 2024-06-14 NOTE — TOC Initial Note (Signed)
 Transition of Care [facility]) - Initial/Assessment Note    Patient Details  Name: Jacqueline Ballard MRN: [id number] Date of Birth: 04/22/1956  Transition of Care [facility]) CM/SW Contact:    Sonda Manuella Quill, RN Phone Number: 06/14/2024, 2:37 PM  Clinical Narrative:                 Spoke w/ pt in room; pt said she lives at home; she plans to return w/ family support at d/c; she identified POC brother in law Larnell Lowers; pt cannot recall his phone #; he will provide transportation; insurance verified; pt said she sees providers at facility on [facility]; she denied SDOH risks; pt does not have DME, HH services, or home oxygen; IP CM will follow.  Expected Discharge Plan: Home/Self Care Barriers to Discharge: Continued Medical Work up   Patient Goals and CMS Choice Patient states their goals for this hospitalization and ongoing recovery are:: home     [place] ownership interest in [facility].provided to:: Patient    Expected Discharge Plan and Services   Discharge Planning Services: CM Consult   Living arrangements for the past 2 months: Single Family Home                 DME Arranged: N/A DME Agency: NA       HH Arranged: NA HH Agency: NA        Prior Living Arrangements/Services Living arrangements for the past 2 months: Single Family Home Lives with:: Self Patient language and need for interpreter reviewed:: Yes Do you feel safe going back to the place where you live?: Yes      Need for Family Participation in Patient Care: Yes (Comment) Care giver support system in place?: Yes (comment) Current home services:  (n/a) Criminal Activity/Legal Involvement Pertinent to Current Situation/Hospitalization: No - Comment as needed  Activities of Daily Living      Permission Sought/Granted Permission sought to share information with : Case Manager Permission granted to share information with : Yes, Verbal Permission Granted  Share Information with NAME:  Case Manager     Permission granted to share info w Relationship: Larnell Lowers (brother in law)     Emotional Assessment Appearance:: Appears stated age Attitude/Demeanor/Rapport: Gracious Affect (typically observed): Accepting Orientation: : Oriented to Self, Oriented to Place, Oriented to  Time, Oriented to Situation Alcohol / Substance Use: Not Applicable Psych Involvement: No (comment)  Admission diagnosis:  Influenza A [J10.1] Teratoma [D48.9] AKI (acute kidney injury) [N17.9] Pneumonia of left lower lobe due to infectious organism [J18.9] Nausea and vomiting, unspecified vomiting type [R11.2] Patient Active Problem List   Diagnosis Date Noted   Influenza A with pneumonia 06/13/2024   Ileus (HCC) 06/13/2024   AKI (acute kidney injury) 06/13/2024   Influenza A 06/13/2024   Hypertension 06/01/2021   Hyperlipidemia 09/15/2020   Tobacco abuse 08/28/2020   PCP:  Patient, No Pcp Per Pharmacy:   DARRYLE LONG - [facility] [address] [place] [address] Phone: [phone] Fax: [phone]     Social Drivers of Health (SDOH) Social History: SDOH Screenings   Food Insecurity: No Food Insecurity (06/14/2024)  Housing: Low Risk (06/14/2024)  Transportation Needs: No Transportation Needs (06/14/2024)  Recent Concern: Transportation Needs - Unmet Transportation Needs (06/13/2024)  Utilities: Not At Risk (06/14/2024)  Financial Resource Strain: Not on File (08/27/2021)   Received from [facility]  Physical Activity: Not on File (08/27/2021)   Received from [facility]  Social Connections:  Moderately Isolated (06/13/2024)  Stress: Not on File (08/27/2021)   Received from [facility]   SDOH Interventions: Food Insecurity Interventions: Intervention Not Indicated, Inpatient TOC Housing Interventions: Intervention Not Indicated, Inpatient TOC Transportation Interventions: Intervention Not Indicated, Inpatient TOC Utilities Interventions: Intervention Not Indicated, Inpatient  TOC   Readmission Risk Interventions     No data to display

## 2024-06-14 NOTE — Progress Notes (Signed)
 " PROGRESS NOTE    Jacqueline Ballard  FMW:[id number] DOB: 02/18/1956 DOA: 06/13/2024 PCP: Patient, No Pcp Per  Chief Complaint  Patient presents with   Cough   Nausea   Emesis    Brief Narrative:   [name] without known medical issues who was brought in for weakness.   The patient was out with family in the car and when they brought her home she was too weak to get out of the car so they turned the car around and brought her straight to the emergency department.  She noted 4-5 days N/V/D and diffuse abdominal pain.    She's been admitted for influenza Jacqueline Ballard infection with imaging findings notable for ileus.  Assessment & Plan:   Principal Problem:   Influenza Daxtyn Rottenberg with pneumonia Active Problems:   Ileus (HCC)   AKI (acute kidney injury)   Influenza Tricha Ruggirello  Small Bowel Ileus  Nausea  Vomiting  Diarrhea CT with findings concerning for small bowel ileus  She's having loose stools and passing gas, mild abdominal discomfort on exam  Continue full liquid diet for now   Influenza Hawley Michel Left Lower Lobe Pneumonia CT concerning for aspiration pneumonia vs infective pneumonia Tamiflu  Ceftriaxone   Klebsiella Pneumoniae  Ceftriaxone   Acute Kidney Injury Related to above, follow  Hypokalemia Replace and follow  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, doesn't seem like she has regular care outpatient    DVT prophylaxis: lovenox  Code Status: full Family Communication: brother in law at bedside Disposition:   Status is: Inpatient Remains inpatient appropriate because: need for continued inpatient care   Consultants:  none  Procedures:  none  Antimicrobials:  Anti-infectives (From admission, onward)    Start     Dose/Rate Route Frequency Ordered Stop   06/14/24 1345  cefTRIAXone  (ROCEPHIN ) 1 g in sodium chloride  0.9 % 100 mL IVPB        1 g 200 mL/hr over 30 Minutes Intravenous Daily 06/14/24 1258 06/19/24 0959   06/14/24 1000  oseltamivir  (TAMIFLU ) capsule 30 mg         30 mg Oral Daily 06/13/24 1716 06/18/24 0959   06/13/24 1545  oseltamivir  (TAMIFLU ) capsule 30 mg        30 mg Oral  Once 06/13/24 1536 06/13/24 1611   06/13/24 1415  cefTRIAXone  (ROCEPHIN ) 1 g in sodium chloride  0.9 % 100 mL IVPB        1 g 200 mL/hr over 30 Minutes Intravenous  Once 06/13/24 1407 06/13/24 1556   06/13/24 1415  azithromycin  (ZITHROMAX ) tablet 500 mg        500 mg Oral  Once 06/13/24 1407 06/13/24 1416       Subjective: none  Objective: Vitals:   06/13/24 1419 06/13/24 1843 06/13/24 2155 06/14/24 0213  BP: 135/86 (!) 100/52 113/69 116/70  Pulse: 90 85 87 84  Resp: 16  18 17   Temp: 97.6 F (36.4 C) 97.6 F (36.4 C) 99.4 F (37.4 C) 99.6 F (37.6 C)  TempSrc: Oral     SpO2: 95% 96% 94% 95%    Intake/Output Summary (Last 24 hours) at 06/14/2024 1515 Last data filed at 06/13/2024 1556 Gross per 24 hour  Intake 2099.5 ml  Output --  Net 2099.5 ml   There were no vitals filed for this visit.  Examination:  General exam: Appears calm and comfortable  Respiratory system: unlabored Cardiovascular system: RRR Gastrointestinal system: distended - mild R sided abdominal discomfort  Central nervous system: Alert and oriented.  No focal neurological deficits. Extremities: no LEE   Data Reviewed: I have personally reviewed following labs and imaging studies  CBC: Recent Labs  Lab 06/13/24 1323 06/14/24 0557  WBC 8.5 6.3  NEUTROABS 6.1  --   HGB 14.7 11.2*  HCT 45.8 33.7*  MCV 72.6* 72.0*  PLT 157 131*    Basic Metabolic Panel: Recent Labs  Lab 06/13/24 1323 06/13/24 1849 06/14/24 0557  NA 128* 129* 130*  K 3.7 3.3* 3.1*  CL 88* 93* 94*  CO2 24 23 24   GLUCOSE 147* 109* 93  BUN 86* 71* 49*  CREATININE 1.99* 1.51* 1.20*  CALCIUM 9.4 8.7* 8.4*  MG  --   --  2.6*    GFR: CrCl cannot be calculated (Unknown ideal weight.).  Liver Function Tests: Recent Labs  Lab 06/13/24 1323  AST 64*  ALT 52*  ALKPHOS 63  BILITOT 1.0  PROT 8.5*   ALBUMIN 4.2    CBG: No results for input(s): GLUCAP in the last 168 hours.   Recent Results (from the past 240 hours)  Resp panel by RT-PCR (RSV, Flu Rathana Viveros&B, Covid) Anterior Nasal Swab     Status: Abnormal   Collection Time: 06/13/24 10:18 AM   Specimen: Anterior Nasal Swab  Result Value Ref Range Status   SARS Coronavirus 2 by RT PCR NEGATIVE NEGATIVE Final    Comment: (NOTE) SARS-CoV-2 target nucleic acids are NOT DETECTED.  The SARS-CoV-2 RNA is generally detectable in upper respiratory specimens during the acute phase of infection. The lowest concentration of SARS-CoV-2 viral copies this assay can detect is 138 copies/mL. Joniqua Sidle negative result does not preclude SARS-Cov-2 infection and should not be used as the sole basis for treatment or other patient management decisions. Torre Pikus negative result may occur with  improper specimen collection/handling, submission of specimen other than nasopharyngeal swab, presence of viral mutation(s) within the areas targeted by this assay, and inadequate number of viral copies(<138 copies/mL). Mateen Franssen negative result must be combined with clinical observations, patient history, and epidemiological information. The expected result is Negative.  Fact Sheet for Patients:  bloggercourse.com  Fact Sheet for Healthcare Providers:  seriousbroker.it  This test is no t yet approved or cleared by the [place]  FDA and  has been authorized for detection and/or diagnosis of SARS-CoV-2 by FDA under an Emergency Use Authorization (EUA). This EUA will remain  in effect (meaning this test can be used) for the duration of the COVID-19 declaration under Section 564(b)(1) of the Act, 21 U.S.C.section 360bbb-3(b)(1), unless the authorization is terminated  or revoked sooner.       Influenza Takeila Thayne by PCR POSITIVE (Naveh Rickles) NEGATIVE Final   Influenza B by PCR NEGATIVE NEGATIVE Final    Comment: (NOTE) The Xpert Xpress  SARS-CoV-2/FLU/RSV plus assay is intended as an aid in the diagnosis of influenza from Nasopharyngeal swab specimens and should not be used as Makenize Messman sole basis for treatment. Nasal washings and aspirates are unacceptable for Xpert Xpress SARS-CoV-2/FLU/RSV testing.  Fact Sheet for Patients: bloggercourse.com  Fact Sheet for Healthcare Providers: seriousbroker.it  This test is not yet approved or cleared by the [place]  FDA and has been authorized for detection and/or diagnosis of SARS-CoV-2 by FDA under an Emergency Use Authorization (EUA). This EUA will remain in effect (meaning this test can be used) for the duration of the COVID-19 declaration under Section 564(b)(1) of the Act, 21 U.S.C. section 360bbb-3(b)(1), unless the authorization is terminated or revoked.     Resp Syncytial Virus by  PCR NEGATIVE NEGATIVE Final    Comment: (NOTE) Fact Sheet for Patients: bloggercourse.com  Fact Sheet for Healthcare Providers: seriousbroker.it  This test is not yet approved or cleared by the [place]  FDA and has been authorized for detection and/or diagnosis of SARS-CoV-2 by FDA under an Emergency Use Authorization (EUA). This EUA will remain in effect (meaning this test can be used) for the duration of the COVID-19 declaration under Section 564(b)(1) of the Act, 21 U.S.C. section 360bbb-3(b)(1), unless the authorization is terminated or revoked.  Performed at [facility], [address], [place], [place] [address]   Urine Culture     Status: Abnormal (Preliminary result)   Collection Time: 06/13/24  1:28 PM   Specimen: Urine, Clean Catch  Result Value Ref Range Status   Specimen Description   Final    URINE, CLEAN CATCH Performed at [facility], [address], [place], [place] [address]    Special Requests   Final    NONE Performed  at [facility], [address], [place], [place] [address]    Culture (Reannon Candella)  Final    >=100,000 COLONIES/mL KLEBSIELLA PNEUMONIAE CULTURE REINCUBATED FOR BETTER GROWTH SUSCEPTIBILITIES TO FOLLOW Performed at [facility], [address] [place], [place] [address]    Report Status PENDING  Incomplete  Blood culture (routine x 2)     Status: None (Preliminary result)   Collection Time: 06/13/24  2:57 PM   Specimen: BLOOD LEFT ARM  Result Value Ref Range Status   Specimen Description   Final    BLOOD LEFT ARM Performed at [facility], [address] [place], [place] [address]    Special Requests   Final    BOTTLES DRAWN AEROBIC AND ANAEROBIC Blood Culture adequate volume Performed at [facility], [address], [place], [place] [address]    Culture   Final    NO GROWTH < 24 HOURS Performed at [facility], [address] [place], [place] [address]    Report Status PENDING  Incomplete         Radiology Studies: CT ABDOMEN PELVIS WO CONTRAST Result Date: 06/13/2024 CLINICAL DATA:  Abd pain/vaginal bleeding. EXAM: CT ABDOMEN AND PELVIS WITHOUT CONTRAST TECHNIQUE: Multidetector CT imaging of the abdomen and pelvis was performed following the standard protocol without IV contrast. RADIATION DOSE REDUCTION: This exam was performed according to the departmental dose-optimization program which includes automated exposure control, adjustment of the mA and/or kV according to patient size and/or use of iterative reconstruction technique. COMPARISON:  None Available. FINDINGS: Lower chest: There are heterogeneous airspace opacities in the left lung lower lobe with associated occlusive and nonocclusive filling defects in the left lung lower lobe distal segmental and subsegmental bronchial tree. Findings may be due to aspiration pneumonia or infective pneumonia. Correlate clinically. Bilateral lungs are otherwise clear. No pleural effusion.  Normal heart size. No pericardial effusion. Hepatobiliary: The liver is normal in size. Non-cirrhotic configuration. No suspicious mass. No intrahepatic or extrahepatic bile duct dilation. The gallbladder is partially contracted and exhibit multiple dependent gallstones without imaging signs of acute cholecystitis. Pancreas: Unremarkable. No pancreatic ductal dilatation or surrounding inflammatory changes. Spleen: Within normal limits. No focal lesion. Adrenals/Urinary Tract: Adrenal glands are unremarkable. No suspicious renal mass within the limitations of this unenhanced exam. Urinary bladder is under distended, precluding optimal assessment. However, no large mass or stones identified. No perivesical fat stranding. Stomach/Bowel: There is disproportionate dilation of  multiple small bowel loops with diameter measuring up to 4 cm. There is also fecalization of multiple small bowel loops. However, no discrete transition point seen. The colon is nondistended. Stomach and duodenum are also not distended. Vascular/Lymphatic: No ascites or pneumoperitoneum. No abdominal or pelvic lymphadenopathy, by size criteria. No aneurysmal dilation of the major abdominal arteries. There are mild peripheral atherosclerotic vascular calcifications of the aorta and its major branches. Reproductive: Not well evaluated on the CT scan exam however, note is made of lobulated uterus containing several calcified and noncalcified leiomyomas. There is Dara Beidleman macrolobulated predominantly macroscopic fat attenuation 7.1 x 9.9 cm lesion in the right lower abdomen which contains few soft tissue attenuation areas along the inferior aspect and few dystrophic calcifications. 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 Left ovary is also not distinctly seen. Other: The visualized soft tissues and abdominal wall are unremarkable. Musculoskeletal: No  suspicious osseous lesions. There are mild multilevel degenerative changes in the visualized spine. IMPRESSION: 1. There is disproportionate dilation of multiple small bowel loops with diameter measuring up to 4 cm. There is also fecalization of multiple small bowel loops. However, no discrete transition point seen. The colon is nondistended. Findings favor small bowel ileus. However, early small bowel obstruction cannot be excluded. Correlate clinically. 2. There is Shemekia Patane macrolobulated predominantly macroscopic fat attenuation 7.1 x 9.9 cm lesion in the right lower abdomen which contains few soft tissue attenuation areas along the inferior aspect and few dystrophic calcifications. 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 Left ovary is also not distinctly seen. 3. There are heterogeneous airspace opacities in the left lung lower lobe with associated occlusive and nonocclusive filling defects in the left lung lower lobe distal segmental and subsegmental bronchial tree. Findings may be due to aspiration pneumonia or infective pneumonia. 4. Cholelithiasis without acute cholecystitis. Aortic Atherosclerosis (ICD10-I70.0). Electronically Signed   By: Ree Molt M.D.   On: 06/13/2024 15:05   DG Chest 2 View Result Date: 06/13/2024 CLINICAL DATA:  Flu like symptoms x1 week. EXAM: CHEST - 2 VIEW COMPARISON:  None Available. FINDINGS: The heart size and mediastinal contours are within normal limits. Low lung volumes are noted with mild elevation of the left hemidiaphragm. Mild left basilar infiltrate is seen. No pleural effusion or pneumothorax is identified. The visualized skeletal structures are unremarkable. IMPRESSION: Mild left basilar infiltrate. Electronically Signed   By: Suzen Dials M.D.   On: 06/13/2024 13:32        Scheduled Meds:  aspirin  EC  81 mg Oral Daily   enoxaparin  (LOVENOX )  injection  30 mg Subcutaneous Q24H   oseltamivir   30 mg Oral Daily   sodium chloride  flush  3 mL Intravenous Q12H   Continuous Infusions:  cefTRIAXone  (ROCEPHIN )  IV 1 g (06/14/24 1325)   lactated ringers  100 mL/hr at 06/14/24 0901     LOS: 1 day    Time spent: over 30 min    Meliton Monte, MD [facility]   To contact the attending provider between 7A-7P or the covering provider during after hours 7P-7A, please log into the web site [email or web address] and access using universal [place] password for that web site. If you do not have the password, please call the hospital operator.  06/14/2024, 3:15 PM    "

## 2024-06-14 NOTE — Plan of Care (Signed)
" °  Problem: Education: Goal: Knowledge of General Education information will improve Description: Including pain rating scale, medication(s)/side effects and non-pharmacologic comfort measures Outcome: Progressing   Problem: Clinical Measurements: Goal: Ability to maintain clinical measurements within normal limits will improve Outcome: Progressing Goal: Will remain free from infection Outcome: Progressing Goal: Diagnostic test results will improve Outcome: Progressing   Problem: Nutrition: Goal: Adequate nutrition will be maintained Outcome: Progressing   Problem: Elimination: Goal: Will not experience complications related to bowel motility Outcome: Progressing Goal: Will not experience complications related to urinary retention Outcome: Progressing   Problem: Safety: Goal: Ability to remain free from injury will improve Outcome: Progressing   Problem: Skin Integrity: Goal: Risk for impaired skin integrity will decrease Outcome: Progressing   "

## 2024-06-15 LAB — CULTURE, BLOOD (ROUTINE X 2)
Culture: NO GROWTH
Special Requests: ADEQUATE

## 2024-06-15 LAB — CBC WITH DIFFERENTIAL/PLATELET
Abs Immature Granulocytes: 0.02 10*3/uL (ref 0.00–0.07)
Basophils Absolute: 0 10*3/uL (ref 0.0–0.1)
Basophils Relative: 0 %
Eosinophils Absolute: 0.1 10*3/uL (ref 0.0–0.5)
Eosinophils Relative: 1 %
HCT: 34.4 % — ABNORMAL LOW (ref 36.0–46.0)
Hemoglobin: 10.9 g/dL — ABNORMAL LOW (ref 12.0–15.0)
Immature Granulocytes: 0 %
Lymphocytes Relative: 30 %
Lymphs Abs: 1.7 10*3/uL (ref 0.7–4.0)
MCH: 23.4 pg — ABNORMAL LOW (ref 26.0–34.0)
MCHC: 31.7 g/dL (ref 30.0–36.0)
MCV: 74 fL — ABNORMAL LOW (ref 80.0–100.0)
Monocytes Absolute: 0.9 10*3/uL (ref 0.1–1.0)
Monocytes Relative: 15 %
Neutro Abs: 2.9 10*3/uL (ref 1.7–7.7)
Neutrophils Relative %: 54 %
Platelets: 143 10*3/uL — ABNORMAL LOW (ref 150–400)
RBC: 4.65 MIL/uL (ref 3.87–5.11)
RDW: 13.3 % (ref 11.5–15.5)
Smear Review: NORMAL
WBC: 5.5 10*3/uL (ref 4.0–10.5)
nRBC: 0 % (ref 0.0–0.2)

## 2024-06-15 LAB — URINE CULTURE: Culture: >=100000 — AB

## 2024-06-15 LAB — COMPREHENSIVE METABOLIC PANEL WITH GFR
ALT: 29 U/L (ref 0–44)
AST: 47 U/L — ABNORMAL HIGH (ref 15–41)
Albumin: 2.9 g/dL — ABNORMAL LOW (ref 3.5–5.0)
Alkaline Phosphatase: 50 U/L (ref 38–126)
Anion gap: 9 (ref 5–15)
BUN: 17 mg/dL (ref 8–23)
CO2: 23 mmol/L (ref 22–32)
Calcium: 8.2 mg/dL — ABNORMAL LOW (ref 8.9–10.3)
Chloride: 101 mmol/L (ref 98–111)
Creatinine, Ser: 0.83 mg/dL (ref 0.44–1.00)
GFR, Estimated: >60 mL/min
Glucose, Bld: 83 mg/dL (ref 70–99)
Potassium: 4.3 mmol/L (ref 3.5–5.1)
Sodium: 133 mmol/L — ABNORMAL LOW (ref 135–145)
Total Bilirubin: 0.6 mg/dL (ref 0.0–1.2)
Total Protein: 5.9 g/dL — ABNORMAL LOW (ref 6.5–8.1)

## 2024-06-15 LAB — PHOSPHORUS
Phosphorus: 1.2 mg/dL — ABNORMAL LOW (ref 2.5–4.6)
Phosphorus: 1.6 mg/dL — ABNORMAL LOW (ref 2.5–4.6)

## 2024-06-15 LAB — MAGNESIUM: Magnesium: 2.2 mg/dL (ref 1.7–2.4)

## 2024-06-15 MED ORDER — AMOXICILLIN-POT CLAVULANATE 875-125 MG PO TABS
1.0000 | ORAL_TABLET | Freq: Two times a day (BID) | ORAL | Status: AC
  Administered 2024-06-15: 1 via ORAL
  Filled 2024-06-15: qty 1

## 2024-06-15 MED ORDER — K PHOS MONO-SOD PHOS DI & MONO 155-852-130 MG PO TABS
500.0000 mg | ORAL_TABLET | ORAL | Status: AC
  Administered 2024-06-15 (×3): 500 mg via ORAL
  Filled 2024-06-15 (×2): qty 2

## 2024-06-15 NOTE — Plan of Care (Signed)

## 2024-06-15 NOTE — Progress Notes (Signed)
 " PROGRESS NOTE    Jacqueline Ballard  FMW:[id number] DOB: Jan 31, 1956 DOA: 06/13/2024 PCP: Patient, No Pcp Per  Chief Complaint  Patient presents with   Cough   Nausea   Emesis    Brief Narrative:   [name] without known medical issues who was brought in for weakness.   The patient was out with family in the car and when they brought her home she was too weak to get out of the car so they turned the car around and brought her straight to the emergency department.  She noted 4-5 days N/V/D and diffuse abdominal pain.    She's been admitted for influenza Jacqueline Ballard infection with imaging findings notable for ileus.  Assessment & Plan:   Principal Problem:   Influenza Jacqueline Ballard with pneumonia Active Problems:   Ileus (HCC)   AKI (acute kidney injury)   Influenza Jacqueline Ballard  Small Bowel Ileus  Nausea  Vomiting  Diarrhea CT with findings concerning for small bowel ileus  She's having loose stools and passing gas, mild abdominal discomfort on exam  Trial soft diet today  Influenza Jacqueline Ballard Left Lower Lobe Pneumonia CT concerning for aspiration pneumonia vs infective pneumonia Tamiflu  augmentin   Klebsiella Pneumoniae UTI augmentin   Acute Kidney Injury Related to above, follow  Hypokalemia Hypophosphatemia Replace and follow  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     DVT prophylaxis: lovenox  Code Status: full Family Communication: brother in law at bedside Disposition:   Status is: Inpatient Remains inpatient appropriate because: need for continued inpatient care   Consultants:  none  Procedures:  none  Antimicrobials:  Anti-infectives (From admission, onward)    Start     Dose/Rate Route Frequency Ordered Stop   06/15/24 2200  amoxicillin -clavulanate (AUGMENTIN ) 875-125 MG per tablet 1 tablet        1 tablet Oral Every 12 hours 06/15/24 1337     06/14/24 1345  cefTRIAXone  (ROCEPHIN ) 1 g in sodium chloride  0.9 % 100 mL IVPB  Status:  Discontinued        1 g 200  mL/hr over 30 Minutes Intravenous Daily 06/14/24 1258 06/15/24 1337   06/14/24 1000  oseltamivir  (TAMIFLU ) capsule 30 mg        30 mg Oral Daily 06/13/24 1716 06/18/24 0959   06/13/24 1545  oseltamivir  (TAMIFLU ) capsule 30 mg        30 mg Oral  Once 06/13/24 1536 06/13/24 1611   06/13/24 1415  cefTRIAXone  (ROCEPHIN ) 1 g in sodium chloride  0.9 % 100 mL IVPB        1 g 200 mL/hr over 30 Minutes Intravenous  Once 06/13/24 1407 06/13/24 1556   06/13/24 1415  azithromycin  (ZITHROMAX ) tablet 500 mg        500 mg Oral  Once 06/13/24 1407 06/13/24 1416       Subjective: Feels ok today  Objective: Vitals:   06/14/24 1943 06/15/24 0532 06/15/24 1019 06/15/24 1303  BP: 115/74 122/82  121/84  Pulse: 76 73  76  Resp: 16 16  18   Temp: 99.2 F (37.3 C) 98.5 F (36.9 C)  98.2 F (36.8 C)  TempSrc: Oral Oral    SpO2: 95% 95%  96%  Weight:   52 kg   Height:   5' 6 (1.676 m)     Intake/Output Summary (Last 24 hours) at 06/15/2024 1422 Last data filed at 06/14/2024 2108 Gross per 24 hour  Intake 1939.71 ml  Output --  Net 1939.71 ml  Filed Weights   06/15/24 1019  Weight: 52 kg    Examination:  General: No acute distress. Cardiovascular: RRR Lungs: unlabored Abdomen: abdominal discomfort is resolved Neurological: Alert and oriented 3. Moves all extremities 4 with equal strength. Cranial nerves II through XII grossly intact. Extremities: No clubbing or cyanosis. No edema.  Data Reviewed: I have personally reviewed following labs and imaging studies  CBC: Recent Labs  Lab 06/13/24 1323 06/14/24 0557 06/15/24 0754  WBC 8.5 6.3 5.5  NEUTROABS 6.1  --  2.9  HGB 14.7 11.2* 10.9*  HCT 45.8 33.7* 34.4*  MCV 72.6* 72.0* 74.0*  PLT 157 131* 143*    Basic Metabolic Panel: Recent Labs  Lab 06/13/24 1323 06/13/24 1849 06/14/24 0557 06/15/24 0754  NA 128* 129* 130* 133*  K 3.7 3.3* 3.1* 4.3  CL 88* 93* 94* 101  CO2 24 23 24 23   GLUCOSE 147* 109* 93 83  BUN 86* 71* 49*  17  CREATININE 1.99* 1.51* 1.20* 0.83  CALCIUM 9.4 8.7* 8.4* 8.2*  MG  --   --  2.6* 2.2  PHOS  --   --   --  1.2*    GFR: Estimated Creatinine Clearance: 54.7 mL/min (by C-G formula based on SCr of 0.83 mg/dL).  Liver Function Tests: Recent Labs  Lab 06/13/24 1323 06/15/24 0754  AST 64* 47*  ALT 52* 29  ALKPHOS 63 50  BILITOT 1.0 0.6  PROT 8.5* 5.9*  ALBUMIN 4.2 2.9*    CBG: No results for input(s): GLUCAP in the last 168 hours.   Recent Results (from the past 240 hours)  Resp panel by RT-PCR (RSV, Flu Vika Buske&B, Covid) Anterior Nasal Swab     Status: Abnormal   Collection Time: 06/13/24 10:18 AM   Specimen: Anterior Nasal Swab  Result Value Ref Range Status   SARS Coronavirus 2 by RT PCR NEGATIVE NEGATIVE Final    Comment: (NOTE) SARS-CoV-2 target nucleic acids are NOT DETECTED.  The SARS-CoV-2 RNA is generally detectable in upper respiratory specimens during the acute phase of infection. The lowest concentration of SARS-CoV-2 viral copies this assay can detect is 138 copies/mL. Naszir Cott negative result does not preclude SARS-Cov-2 infection and should not be used as the sole basis for treatment or other patient management decisions. Piero Mustard negative result may occur with  improper specimen collection/handling, submission of specimen other than nasopharyngeal swab, presence of viral mutation(s) within the areas targeted by this assay, and inadequate number of viral copies(<138 copies/mL). Jacqueline Ballard negative result must be combined with clinical observations, patient history, and epidemiological information. The expected result is Negative.  Fact Sheet for Patients:  bloggercourse.com  Fact Sheet for Healthcare Providers:  seriousbroker.it  This test is no t yet approved or cleared by the [place]  FDA and  has been authorized for detection and/or diagnosis of SARS-CoV-2 by FDA under an Emergency Use Authorization (EUA). This EUA  will remain  in effect (meaning this test can be used) for the duration of the COVID-19 declaration under Section 564(b)(1) of the Act, 21 U.S.C.section 360bbb-3(b)(1), unless the authorization is terminated  or revoked sooner.       Influenza Kali Ambler by PCR POSITIVE (Montel Vanderhoof) NEGATIVE Final   Influenza B by PCR NEGATIVE NEGATIVE Final    Comment: (NOTE) The Xpert Xpress SARS-CoV-2/FLU/RSV plus assay is intended as an aid in the diagnosis of influenza from Nasopharyngeal swab specimens and should not be used as Daizha Anand sole basis for treatment. Nasal washings and aspirates are unacceptable for Xpert  Xpress SARS-CoV-2/FLU/RSV testing.  Fact Sheet for Patients: bloggercourse.com  Fact Sheet for Healthcare Providers: seriousbroker.it  This test is not yet approved or cleared by the [place]  FDA and has been authorized for detection and/or diagnosis of SARS-CoV-2 by FDA under an Emergency Use Authorization (EUA). This EUA will remain in effect (meaning this test can be used) for the duration of the COVID-19 declaration under Section 564(b)(1) of the Act, 21 U.S.C. section 360bbb-3(b)(1), unless the authorization is terminated or revoked.     Resp Syncytial Virus by PCR NEGATIVE NEGATIVE Final    Comment: (NOTE) Fact Sheet for Patients: bloggercourse.com  Fact Sheet for Healthcare Providers: seriousbroker.it  This test is not yet approved or cleared by the [place]  FDA and has been authorized for detection and/or diagnosis of SARS-CoV-2 by FDA under an Emergency Use Authorization (EUA). This EUA will remain in effect (meaning this test can be used) for the duration of the COVID-19 declaration under Section 564(b)(1) of the Act, 21 U.S.C. section 360bbb-3(b)(1), unless the authorization is terminated or revoked.  Performed at [facility], [address] [place], [place] [address]   Urine Culture     Status: Abnormal   Collection Time: 06/13/24  1:28 PM   Specimen: Urine, Clean Catch  Result Value Ref Range Status   Specimen Description   Final    URINE, CLEAN CATCH Performed at [facility], [address] [place], [place] [address]    Special Requests   Final    NONE Performed at [facility], [address] [place], [place] [address]    Culture >=100,000 COLONIES/mL KLEBSIELLA PNEUMONIAE (Kandie Keiper)  Final   Report Status 06/15/2024 FINAL  Final   Organism ID, Bacteria KLEBSIELLA PNEUMONIAE (Jamez Ambrocio)  Final      Susceptibility   Klebsiella pneumoniae - MIC*    AMPICILLIN >=32 RESISTANT Resistant     CEFAZOLIN (URINE) Value in next row Sensitive      2 SENSITIVEThis is Brodi Kari modified FDA-approved test that has been validated and its performance characteristics determined by the reporting laboratory.  This laboratory is certified under the Clinical Laboratory Improvement Amendments CLIA as qualified to perform high complexity clinical laboratory testing.    CEFEPIME Value in next row Sensitive      2 SENSITIVEThis is Darianny Momon modified FDA-approved test that has been validated and its performance characteristics determined by the reporting laboratory.  This laboratory is certified under the Clinical Laboratory Improvement Amendments CLIA as qualified to perform high complexity clinical laboratory testing.    ERTAPENEM Value in next row Sensitive      2 SENSITIVEThis is Elijah Michaelis modified FDA-approved test that has been validated and its performance characteristics determined by the reporting laboratory.  This laboratory is certified under the Clinical Laboratory Improvement Amendments CLIA as qualified to perform high complexity clinical laboratory testing.    CEFTRIAXONE  Value in next row Sensitive      2 SENSITIVEThis is Levar Fayson modified FDA-approved test that has been validated and its performance characteristics determined by the  reporting laboratory.  This laboratory is certified under the Clinical Laboratory Improvement Amendments CLIA as qualified to perform high complexity clinical laboratory testing.    CIPROFLOXACIN Value in next row Sensitive      2 SENSITIVEThis is Brendi Mccarroll modified FDA-approved test that has been validated and its performance characteristics determined by the reporting laboratory.  This laboratory is certified under the Clinical Laboratory Improvement Amendments CLIA as qualified to perform high complexity  clinical laboratory testing.    GENTAMICIN Value in next row Sensitive      2 SENSITIVEThis is Janye Maynor modified FDA-approved test that has been validated and its performance characteristics determined by the reporting laboratory.  This laboratory is certified under the Clinical Laboratory Improvement Amendments CLIA as qualified to perform high complexity clinical laboratory testing.    NITROFURANTOIN Value in next row Intermediate      2 SENSITIVEThis is Daiwik Buffalo modified FDA-approved test that has been validated and its performance characteristics determined by the reporting laboratory.  This laboratory is certified under the Clinical Laboratory Improvement Amendments CLIA as qualified to perform high complexity clinical laboratory testing.    TRIMETH/SULFA Value in next row Sensitive      2 SENSITIVEThis is Rosalie Buenaventura modified FDA-approved test that has been validated and its performance characteristics determined by the reporting laboratory.  This laboratory is certified under the Clinical Laboratory Improvement Amendments CLIA as qualified to perform high complexity clinical laboratory testing.    AMPICILLIN/SULBACTAM Value in next row Sensitive      2 SENSITIVEThis is Raisha Brabender modified FDA-approved test that has been validated and its performance characteristics determined by the reporting laboratory.  This laboratory is certified under the Clinical Laboratory Improvement Amendments CLIA as qualified to perform high complexity  clinical laboratory testing.    PIP/TAZO Value in next row Sensitive      <=4 SENSITIVEThis is Gage Weant modified FDA-approved test that has been validated and its performance characteristics determined by the reporting laboratory.  This laboratory is certified under the Clinical Laboratory Improvement Amendments CLIA as qualified to perform high complexity clinical laboratory testing.    MEROPENEM Value in next row Sensitive      <=4 SENSITIVEThis is Jerriah Ines modified FDA-approved test that has been validated and its performance characteristics determined by the reporting laboratory.  This laboratory is certified under the Clinical Laboratory Improvement Amendments CLIA as qualified to perform high complexity clinical laboratory testing.    * >=100,000 COLONIES/mL KLEBSIELLA PNEUMONIAE  Blood culture (routine x 2)     Status: None (Preliminary result)   Collection Time: 06/13/24  2:57 PM   Specimen: BLOOD LEFT ARM  Result Value Ref Range Status   Specimen Description   Final    BLOOD LEFT ARM Performed at [facility], [address], [place], [place] [address]    Special Requests   Final    BOTTLES DRAWN AEROBIC AND ANAEROBIC Blood Culture adequate volume Performed at [facility], [address], [place], [place] [address]    Culture   Final    NO GROWTH 2 DAYS Performed at [facility], [address] [place], [place] [address]    Report Status PENDING  Incomplete         Radiology Studies: CT ABDOMEN PELVIS WO CONTRAST Result Date: 06/13/2024 CLINICAL DATA:  Abd pain/vaginal bleeding. EXAM: CT ABDOMEN AND PELVIS WITHOUT CONTRAST TECHNIQUE: Multidetector CT imaging of the abdomen and pelvis was performed following the standard protocol without IV contrast. RADIATION DOSE REDUCTION: This exam was performed according to the departmental dose-optimization program which includes automated exposure control, adjustment of the mA and/or kV according to patient size and/or  use of iterative reconstruction technique. COMPARISON:  None Available. FINDINGS: Lower chest: There are heterogeneous airspace opacities in the left lung lower lobe with associated occlusive and nonocclusive filling defects in the left lung lower lobe distal segmental and subsegmental bronchial tree. Findings may be due to aspiration pneumonia or infective  pneumonia. Correlate clinically. Bilateral lungs are otherwise clear. No pleural effusion. Normal heart size. No pericardial effusion. Hepatobiliary: The liver is normal in size. Non-cirrhotic configuration. No suspicious mass. No intrahepatic or extrahepatic bile duct dilation. The gallbladder is partially contracted and exhibit multiple dependent gallstones without imaging signs of acute cholecystitis. Pancreas: Unremarkable. No pancreatic ductal dilatation or surrounding inflammatory changes. Spleen: Within normal limits. No focal lesion. Adrenals/Urinary Tract: Adrenal glands are unremarkable. No suspicious renal mass within the limitations of this unenhanced exam. Urinary bladder is under distended, precluding optimal assessment. However, no large mass or stones identified. No perivesical fat stranding. Stomach/Bowel: There is disproportionate dilation of multiple small bowel loops with diameter measuring up to 4 cm. There is also fecalization of multiple small bowel loops. However, no discrete transition point seen. The colon is nondistended. Stomach and duodenum are also not distended. Vascular/Lymphatic: No ascites or pneumoperitoneum. No abdominal or pelvic lymphadenopathy, by size criteria. No aneurysmal dilation of the major abdominal arteries. There are mild peripheral atherosclerotic vascular calcifications of the aorta and its major branches. Reproductive: Not well evaluated on the CT scan exam however, note is made of lobulated uterus containing several calcified and noncalcified leiomyomas. There is Yashika Mask macrolobulated predominantly macroscopic fat  attenuation 7.1 x 9.9 cm lesion in the right lower abdomen which contains few soft tissue attenuation areas along the inferior aspect and few dystrophic calcifications. 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 Left ovary is also not distinctly seen. Other: The visualized soft tissues and abdominal wall are unremarkable. Musculoskeletal: No suspicious osseous lesions. There are mild multilevel degenerative changes in the visualized spine. IMPRESSION: 1. There is disproportionate dilation of multiple small bowel loops with diameter measuring up to 4 cm. There is also fecalization of multiple small bowel loops. However, no discrete transition point seen. The colon is nondistended. Findings favor small bowel ileus. However, early small bowel obstruction cannot be excluded. Correlate clinically. 2. There is Traniyah Hallett macrolobulated predominantly macroscopic fat attenuation 7.1 x 9.9 cm lesion in the right lower abdomen which contains few soft tissue attenuation areas along the inferior aspect and few dystrophic calcifications. 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 Left ovary is also not distinctly seen. 3. There are heterogeneous airspace opacities in the left lung lower lobe with associated occlusive and nonocclusive filling defects in the left lung lower lobe distal segmental and subsegmental bronchial tree. Findings may be due to aspiration pneumonia or infective pneumonia. 4. Cholelithiasis without acute cholecystitis. Aortic Atherosclerosis (ICD10-I70.0). Electronically Signed   By: Ree Molt M.D.   On: 06/13/2024 15:05        Scheduled Meds:  amoxicillin -clavulanate  1 tablet Oral Q12H   aspirin  EC  81 mg Oral Daily   enoxaparin  (LOVENOX ) injection  30 mg Subcutaneous Q24H   oseltamivir   30 mg  Oral Daily   phosphorus  500 mg Oral Q4H   sodium chloride  flush  3 mL Intravenous Q12H   Continuous Infusions:     LOS: 2 days    Time spent: over 30 min    Meliton Monte, MD [facility]   To contact the attending provider between 7A-7P or the covering provider during after hours 7P-7A, please log into the web site [email or web address] and access using universal Guy password for that web site. If you do not have  the password, please call the hospital operator.  06/15/2024, 2:22 PM    "
